# Patient Record
Sex: Male | Born: 1990 | Race: White | Hispanic: No | Marital: Single | Smoking: Current every day smoker
Health system: Southern US, Community
[De-identification: ages and names within clinical notes are randomized; demographics above are authoritative.]

## PROBLEM LIST (undated history)

## (undated) DIAGNOSIS — F909 Attention-deficit hyperactivity disorder, unspecified type: Secondary | ICD-10-CM

## (undated) DIAGNOSIS — F419 Anxiety disorder, unspecified: Secondary | ICD-10-CM

## (undated) HISTORY — DX: Attention-deficit hyperactivity disorder, unspecified type: F90.9

## (undated) HISTORY — DX: Anxiety disorder, unspecified: F41.9

## (undated) HISTORY — PX: NO PAST SURGERIES: SHX2092

---
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
## 2009-11-02 ENCOUNTER — Emergency Department: Payer: Self-pay | Admitting: Emergency Medicine

## 2010-06-19 ENCOUNTER — Emergency Department: Payer: Self-pay | Admitting: Emergency Medicine

## 2010-06-22 ENCOUNTER — Emergency Department: Payer: Self-pay | Admitting: Emergency Medicine

## 2011-08-31 ENCOUNTER — Emergency Department: Payer: Self-pay | Admitting: *Deleted

## 2012-05-28 ENCOUNTER — Emergency Department: Payer: Self-pay | Admitting: Emergency Medicine

## 2012-08-17 ENCOUNTER — Emergency Department: Payer: Self-pay | Admitting: Emergency Medicine

## 2012-10-28 ENCOUNTER — Emergency Department: Payer: Self-pay | Admitting: Emergency Medicine

## 2012-11-20 ENCOUNTER — Emergency Department: Payer: Self-pay | Admitting: Emergency Medicine

## 2013-03-24 ENCOUNTER — Emergency Department: Payer: Self-pay | Admitting: Emergency Medicine

## 2014-01-20 ENCOUNTER — Emergency Department: Payer: Self-pay | Admitting: Emergency Medicine

## 2014-01-22 ENCOUNTER — Emergency Department: Payer: Self-pay | Admitting: Emergency Medicine

## 2014-05-20 ENCOUNTER — Emergency Department: Payer: Self-pay | Admitting: Emergency Medicine

## 2014-08-07 ENCOUNTER — Encounter: Payer: Self-pay | Admitting: Emergency Medicine

## 2014-08-07 ENCOUNTER — Emergency Department: Payer: Self-pay

## 2014-08-07 ENCOUNTER — Emergency Department
Admission: EM | Admit: 2014-08-07 | Discharge: 2014-08-07 | Disposition: A | Discharge status: Home / Self Care | Payer: Self-pay | Attending: Emergency Medicine | Admitting: Emergency Medicine

## 2014-08-07 DIAGNOSIS — Y9389 Activity, other specified: Secondary | ICD-10-CM | POA: Insufficient documentation

## 2014-08-07 DIAGNOSIS — Z880 Allergy status to penicillin: Secondary | ICD-10-CM | POA: Insufficient documentation

## 2014-08-07 DIAGNOSIS — Y92410 Unspecified street and highway as the place of occurrence of the external cause: Secondary | ICD-10-CM | POA: Insufficient documentation

## 2014-08-07 DIAGNOSIS — Z720 Tobacco use: Secondary | ICD-10-CM | POA: Insufficient documentation

## 2014-08-07 DIAGNOSIS — Y998 Other external cause status: Secondary | ICD-10-CM | POA: Insufficient documentation

## 2014-08-07 DIAGNOSIS — S161XXA Strain of muscle, fascia and tendon at neck level, initial encounter: Secondary | ICD-10-CM | POA: Insufficient documentation

## 2014-08-07 MED ORDER — IBUPROFEN 800 MG PO TABS: 800.0000 mg | ORAL_TABLET | Freq: Three times a day (TID) | ORAL | Status: DC | PRN

## 2014-08-07 MED ORDER — CYCLOBENZAPRINE HCL 10 MG PO TABS
10.0000 mg | ORAL_TABLET | Freq: Three times a day (TID) | ORAL | Status: DC | PRN
Start: 2014-08-07 — End: 2019-04-21
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
## 2015-02-06 ENCOUNTER — Emergency Department (HOSPITAL_COMMUNITY)
Admission: EM | Admit: 2015-02-06 | Discharge: 2015-02-06 | Disposition: A | Discharge status: Home / Self Care | Payer: BLUE CROSS/BLUE SHIELD | Attending: Emergency Medicine | Admitting: Emergency Medicine

## 2015-02-06 ENCOUNTER — Encounter (HOSPITAL_COMMUNITY): Payer: Self-pay | Admitting: Emergency Medicine

## 2015-02-06 DIAGNOSIS — K047 Periapical abscess without sinus: Secondary | ICD-10-CM | POA: Diagnosis not present

## 2015-02-06 DIAGNOSIS — Z880 Allergy status to penicillin: Secondary | ICD-10-CM | POA: Insufficient documentation

## 2015-02-06 DIAGNOSIS — F17210 Nicotine dependence, cigarettes, uncomplicated: Secondary | ICD-10-CM | POA: Diagnosis not present

## 2015-02-06 DIAGNOSIS — K029 Dental caries, unspecified: Secondary | ICD-10-CM | POA: Insufficient documentation

## 2015-02-06 DIAGNOSIS — K0889 Other specified disorders of teeth and supporting structures: Secondary | ICD-10-CM | POA: Diagnosis present

## 2015-02-06 DIAGNOSIS — K0381 Cracked tooth: Secondary | ICD-10-CM | POA: Diagnosis not present

## 2015-02-06 MED ORDER — HYDROCODONE-ACETAMINOPHEN 5-325 MG PO TABS
2.0000 | ORAL_TABLET | Freq: Once | ORAL | Status: AC
  Administered 2015-02-06: 2 via ORAL
  Filled 2015-02-06: qty 2

## 2015-02-06 MED ORDER — CLINDAMYCIN HCL 300 MG PO CAPS: 300.0000 mg | ORAL_CAPSULE | Freq: Four times a day (QID) | ORAL | Status: DC

## 2015-02-06 MED ORDER — HYDROCODONE-ACETAMINOPHEN 5-325 MG PO TABS: ORAL_TABLET | ORAL | Status: DC

## 2015-02-06 MED ORDER — CLINDAMYCIN HCL 150 MG PO CAPS
300.0000 mg | ORAL_CAPSULE | Freq: Once | ORAL | Status: AC
  Administered 2015-02-06: 300 mg via ORAL
  Filled 2015-02-06: qty 2

## 2015-02-06 NOTE — ED Notes (Signed)
Patient c/o right upper dental pain with swelling to gums. Patient reports not being able to eat since Friday. Per patient toke oxycodone 5mg  last night with no relief. Denies taking anything for pain today.

## 2015-02-06 NOTE — ED Notes (Addendum)
Pain to right upper molars. States an ache to the area all week which became worse Thursday, unable to eat solid foods. States he has tried ibuprofen 800mg , his sister's lidocaine which she had from a previous dental visit and one of his mom's oxycodone last night, all with no relief. Small amount of facial swelling noted to right jaw area.

## 2015-02-06 NOTE — ED Provider Notes (Signed)
CSN: [id number]     Arrival date & time 02/06/15  1606 History  By signing my name below, I, Scott Juarez, attest that this documentation has been prepared under the direction and in the presence of Scott Parkinson, PA-C  Electronically Signed: Tula Juarez, ED Scribe. 02/06/2015. 4:33 PM.   Chief Complaint  Patient presents with  . Dental Pain   Patient is a 24 y.o. male presenting with tooth pain. The history is provided by the patient. No language interpreter was used.  Dental Pain Location:  Upper Upper teeth location:  1/RU 3rd molar Quality:  Aching and throbbing Severity:  Moderate Onset quality:  Gradual Duration:  1 week Timing:  Constant Progression:  Worsening Chronicity:  New Context: abscess and dental fracture   Relieved by:  Nothing Worsened by:  Jaw movement Ineffective treatments:  NSAIDs (Narcotics) Associated symptoms: facial swelling     HPI Comments: Scott Juarez is a 24 y.o. male who presents to the Emergency Department complaining of gradual onset, constant, moderate right upper dental pain associated with a dental fracture that started 1 week ago and became worse 3 days ago. He reports right-sided facial swelling and chills as associated symptoms. His pain becomes worse with chewing. Pt tried oxycodone-5 mg and Ibuprofen-800 mg with no relief. He denies difficulty swallowing.  History reviewed. No pertinent past medical history. History reviewed. No pertinent past surgical history. History reviewed. No pertinent family history. Social History  Substance Use Topics  . Smoking status: Current Every Day Smoker -- 0.50 packs/day    Types: Cigarettes  . Smokeless tobacco: Never Used  . Alcohol Use: No   Review of Systems  Constitutional: Positive for chills.  HENT: Positive for dental problem and facial swelling. Negative for trouble swallowing.   All other systems reviewed and are negative.  Allergies  Amoxicillin  Home Medications   Prior  to Admission medications   Medication Sig Start Date End Date Taking? Authorizing Provider  cyclobenzaprine (FLEXERIL) 10 MG tablet Take 1 tablet (10 mg total) by mouth every 8 (eight) hours as needed for muscle spasms. 08/07/14   Scott Crane Beers, PA-C  ibuprofen (ADVIL,MOTRIN) 800 MG tablet Take 1 tablet (800 mg total) by mouth every 8 (eight) hours as needed. 08/07/14   Scott Crane Beers, PA-C   BP 156/91 mmHg  Pulse 92  Temp(Src) 98.5 F (36.9 C) (Oral)  Resp 16  Ht 6\' 2"  (1.88 m)  Wt 200 lb (90.719 kg)  BMI 25.67 kg/m2  SpO2 100% Physical Exam  Constitutional: He appears well-developed and well-nourished. No distress.  HENT:  Head: Normocephalic and atraumatic.  Mouth/Throat: Oropharynx is clear and moist. No oropharyngeal exudate.  Tenderness and dental decay of right upper 3rd molar Mild erythema of the surrounding gingiva  Eyes: Conjunctivae and EOM are normal.  Neck: Neck supple. No tracheal deviation present.  Cardiovascular: Normal rate, regular rhythm and normal heart sounds.   Pulmonary/Chest: Effort normal and breath sounds normal. No respiratory distress. He has no wheezes. He has no rales.  Skin: Skin is warm and dry.  Psychiatric: He has a normal mood and affect. His behavior is normal.  Nursing note and vitals reviewed.   ED Course  Procedures  DIAGNOSTIC STUDIES: Oxygen Saturation is 100% on RA, normal by my interpretation.    COORDINATION OF CARE: 4:30 PM Discussed treatment plan with pt which includes antibiotics and referral to a dentist. Pt agreed to plan.    MDM   Final diagnoses:  Abscess,  dental    Pt is well appearing, airway patent.  No concerning sx's for Ludwig's angina.  Given dental referral list , he agrees to arrange f/u  I personally performed the services described in this documentation, which was scribed in my presence. The recorded information has been reviewed and is accurate.    Scott Parkinson, PA-C 02/06/15 [place],  MD 02/06/15 2230

## 2015-02-06 NOTE — Discharge Instructions (Signed)
Dental Abscess A dental abscess is pus in or around a tooth. HOME CARE  Take medicines only as told by your dentist.  If you were prescribed antibiotic medicine, finish all of it even if you start to feel better.  Rinse your mouth (gargle) often with salt water.  Do not drive or use heavy machinery, like a lawn mower, while taking pain medicine.  Do not apply heat to the outside of your mouth.  Keep all follow-up visits as told by your dentist. This is important. GET HELP IF:  Your pain is worse, and medicine does not help. GET HELP RIGHT AWAY IF:  You have a fever or chills.  Your symptoms suddenly get worse.  You have a very bad headache.  You have problems breathing or swallowing.  You have trouble opening your mouth.  You have puffiness (swelling) in your neck or around your eye.   This information is not intended to replace advice given to you by your health care provider. Make sure you discuss any questions you have with your health care provider.   Document Released: 07/06/2014 Document Reviewed: 07/06/2014 Elsevier Interactive Patient Education 2016 [employer]  

## 2016-05-17 ENCOUNTER — Encounter: Payer: Self-pay | Admitting: Emergency Medicine

## 2016-05-17 ENCOUNTER — Emergency Department
Admission: EM | Admit: 2016-05-17 | Discharge: 2016-05-17 | Disposition: A | Discharge status: Home / Self Care | Payer: BLUE CROSS/BLUE SHIELD | Attending: Emergency Medicine | Admitting: Emergency Medicine

## 2016-05-17 DIAGNOSIS — L03114 Cellulitis of left upper limb: Secondary | ICD-10-CM | POA: Insufficient documentation

## 2016-05-17 DIAGNOSIS — D1722 Benign lipomatous neoplasm of skin and subcutaneous tissue of left arm: Secondary | ICD-10-CM | POA: Insufficient documentation

## 2016-05-17 DIAGNOSIS — F17210 Nicotine dependence, cigarettes, uncomplicated: Secondary | ICD-10-CM | POA: Insufficient documentation

## 2016-05-17 MED ORDER — CLINDAMYCIN HCL 300 MG PO CAPS: 300.0000 mg | ORAL_CAPSULE | Freq: Four times a day (QID) | ORAL | 0 refills | Status: DC

## 2016-05-17 NOTE — ED Provider Notes (Signed)
[facility] Emergency Department Provider Note  ____________________________________________  Time seen: Approximately 7:31 PM  I have reviewed the triage vital signs and the nursing notes.   HISTORY  Chief Complaint Abscess    HPI Scott Juarez is a 26 y.o. male [place] emergency department for an infected "abscess" to the left arm. Patient reports that he has had a "bubble" underneath the skin over a year. Patient reports that it is "like a marble" underneath the skin. He reports over the last week areas become red, edematous. Reports that this morning in the shower area was hurting and he "squeezed it" until pus was expressed. Patient reports surrounding erythema and edema to the region. He denies any fevers or chills, nausea or vomiting. No medications prior to arrival. Patient does have a history of recurrent skin lesions   History reviewed. No pertinent past medical history.  There are no active problems to display for this patient.   History reviewed. No pertinent surgical history.  Prior to Admission medications   Medication Sig Start Date End Date Taking? Authorizing Provider  clindamycin (CLEOCIN) 300 MG capsule Take 1 capsule (300 mg total) by mouth 4 (four) times daily. 05/17/16   Charline Bills Cuthriell, PA-C  cyclobenzaprine (FLEXERIL) 10 MG tablet Take 1 tablet (10 mg total) by mouth every 8 (eight) hours as needed for muscle spasms. 08/07/14   Arlyss Repress, PA-C  HYDROcodone-acetaminophen (NORCO/VICODIN) 5-325 MG tablet Take one-two tabs po q 4-6 hrs prn pain 02/06/15   Tammy Triplett, PA-C  ibuprofen (ADVIL,MOTRIN) 800 MG tablet Take 1 tablet (800 mg total) by mouth every 8 (eight) hours as needed. 08/07/14   Arlyss Repress, PA-C    Allergies Amoxicillin  History reviewed. No pertinent family history.  Social History Social History  Substance Use Topics  . Smoking status: Current Every Day Smoker    Packs/day: 0.50    Types: Cigarettes   . Smokeless tobacco: Never Used  . Alcohol use No     Review of Systems  Constitutional: No fever/chills Eyes: No visual changes.  Cardiovascular: no chest pain. Respiratory: no cough. No SOB. Gastrointestinal: No abdominal pain.  No nausea, no vomiting.  No diarrhea.  No constipation. Musculoskeletal: Negative for musculoskeletal pain. Skin: As a "abscess" to the left forearm Neurological: Negative for headaches, focal weakness or numbness. 10-point ROS otherwise negative.  ____________________________________________   PHYSICAL EXAM:  VITAL SIGNS: ED Triage Vitals  Enc Vitals Group     BP 05/17/16 1902 (!) 150/80     Pulse Rate 05/17/16 1902 83     Resp 05/17/16 1902 16     Temp 05/17/16 1902 97.7 F (36.5 C)     Temp Source 05/17/16 1902 Oral     SpO2 05/17/16 1902 100 %     Weight 05/17/16 1902 190 lb (86.2 kg)     Height 05/17/16 1902 6\' 2"  (1.88 m)     Head Circumference --      Peak Flow --      Pain Score 05/17/16 1903 3     Pain Loc --      Pain Edu? --      Excl. in [place]? --      Constitutional: Alert and oriented. Well appearing and in no acute distress. Eyes: Conjunctivae are normal. PERRL. EOMI. Head: Atraumatic.Marland Kitchen  Neck: No stridor.   Cardiovascular: Normal rate, regular rhythm. Normal S1 and S2.  Good peripheral circulation. Respiratory: Normal respiratory effort without tachypnea or retractions. Lungs  CTAB. Good air entry to the bases with no decreased or absent breath sounds. Musculoskeletal: Full range of motion to all extremities. No gross deformities appreciated. Neurologic:  Normal speech and language. No gross focal neurologic deficits are appreciated.  Skin:  Skin is warm, dry and intact. No rash noted. The medicine edematous skin lesion noted to the left lateral elbow. Area is firm to palpation but no induration or fluctuance. Crusted drainage is noted on the most inferior aspect of lesion. The patient lesion does not express any pustular  drainage. Psychiatric: Mood and affect are normal. Speech and behavior are normal. Patient exhibits appropriate insight and judgement.   ____________________________________________   LABS (all labs ordered are listed, but only abnormal results are displayed)  Labs Reviewed - No data to display ____________________________________________  EKG   ____________________________________________  RADIOLOGY   No results found.  ____________________________________________    PROCEDURES  Procedure(s) performed:    Procedures    Medications - No data to display   ____________________________________________   INITIAL IMPRESSION / ASSESSMENT AND PLAN / ED COURSE  Pertinent labs & imaging results that were available during my care of the patient were reviewed by me and considered in my medical decision making (see chart for details).  Review of the [place] CSRS was performed in accordance of the [place] prior to dispensing any controlled drugs.     Patient's diagnosis is consistent with lipoma to the left elbow with overlying infection. At this time, exam does not reveal abscess requiring incision and drainage. Patient does have a history of recurrent skin lesions, and as such she will be placed on clindamycin for coverage. Patient is to follow-up with dermatology for recurrent skin lesions and multiple lipomas.  Patient is given ED precautions to return to the ED for any worsening or new symptoms.     ____________________________________________  FINAL CLINICAL IMPRESSION(S) / ED DIAGNOSES  Final diagnoses:  Cellulitis of left upper extremity  Lipoma of left upper extremity      NEW MEDICATIONS STARTED DURING THIS VISIT:  Discharge Medication List as of 05/17/2016  7:34 PM          This chart was dictated using voice recognition software/Dragon. Despite best efforts to proofread, errors can occur which can change the meaning. Any change was purely  unintentional.    Darletta Moll, PA-C 05/17/16 1958    Lavonia Drafts, MD 05/17/16 [phone]

## 2016-05-17 NOTE — ED Triage Notes (Signed)
Pt ambulatory to triage in NAD, reports abscess to left arm x 1 year, but states over past couple days has become red and painful.  Pt reports some drainage.

## 2019-01-21 ENCOUNTER — Emergency Department
Admission: EM | Admit: 2019-01-21 | Discharge: 2019-01-21 | Discharge status: Against Medical Advice | Payer: BLUE CROSS/BLUE SHIELD

## 2019-01-21 NOTE — ED Notes (Signed)
No answer when called several times from lobby 

## 2019-04-21 ENCOUNTER — Encounter: Payer: Self-pay | Admitting: Emergency Medicine

## 2019-04-21 ENCOUNTER — Other Ambulatory Visit: Payer: Self-pay

## 2019-04-21 ENCOUNTER — Emergency Department: Payer: BLUE CROSS/BLUE SHIELD

## 2019-04-21 ENCOUNTER — Emergency Department
Admission: EM | Admit: 2019-04-21 | Discharge: 2019-04-21 | Disposition: A | Discharge status: Home / Self Care | Payer: BLUE CROSS/BLUE SHIELD | Attending: Emergency Medicine | Admitting: Emergency Medicine

## 2019-04-21 DIAGNOSIS — Y999 Unspecified external cause status: Secondary | ICD-10-CM | POA: Insufficient documentation

## 2019-04-21 DIAGNOSIS — W108XXA Fall (on) (from) other stairs and steps, initial encounter: Secondary | ICD-10-CM | POA: Diagnosis not present

## 2019-04-21 DIAGNOSIS — F17210 Nicotine dependence, cigarettes, uncomplicated: Secondary | ICD-10-CM | POA: Diagnosis not present

## 2019-04-21 DIAGNOSIS — Y929 Unspecified place or not applicable: Secondary | ICD-10-CM | POA: Insufficient documentation

## 2019-04-21 DIAGNOSIS — S3992XA Unspecified injury of lower back, initial encounter: Secondary | ICD-10-CM | POA: Diagnosis present

## 2019-04-21 DIAGNOSIS — Y9389 Activity, other specified: Secondary | ICD-10-CM | POA: Insufficient documentation

## 2019-04-21 DIAGNOSIS — M533 Sacrococcygeal disorders, not elsewhere classified: Secondary | ICD-10-CM | POA: Diagnosis not present

## 2019-04-21 DIAGNOSIS — S300XXA Contusion of lower back and pelvis, initial encounter: Secondary | ICD-10-CM | POA: Diagnosis not present

## 2019-04-21 MED ORDER — OXYCODONE-ACETAMINOPHEN 7.5-325 MG PO TABS: 1.0000 | ORAL_TABLET | Freq: Four times a day (QID) | ORAL | 0 refills | Status: DC | PRN

## 2019-04-21 MED ORDER — LIDOCAINE 5 % EX PTCH
1.0000 | MEDICATED_PATCH | CUTANEOUS | Status: DC
  Administered 2019-04-21: 09:00:00 1 via TRANSDERMAL
  Filled 2019-04-21: qty 1

## 2019-04-21 MED ORDER — IBUPROFEN 600 MG PO TABS: 600.0000 mg | ORAL_TABLET | Freq: Three times a day (TID) | ORAL | 0 refills | Status: DC | PRN

## 2019-04-21 NOTE — ED Triage Notes (Signed)
Patient reports slipping and falling and hitting his buttocks on the corner of the step that he tripped on.  Patient is complaining of pain with sitting.  Patient appears uncomfortable.  Patient ambulatory to triage.

## 2019-04-21 NOTE — ED Provider Notes (Signed)
[facility] Emergency Department Provider Note   ____________________________________________   First MD Initiated Contact with Patient 04/21/19 [phone]     (approximate)  I have reviewed the triage vital signs and the nursing notes.   HISTORY  Chief Complaint Fall    HPI Scott Juarez is a 29 y.o. male patient complain of tailbone pain secondary to a slip and fall earlier this morning.  Patient said he slipped and fell landing on his coccyx on the edge of a step.  Patient the pain has increased since incident.  Patient state he cannot sit comfortably secondary to the pain.  Patient rates pain as a 10/10.  Patient scribed pain is "sharp".  Patient denies radicular component to his pain.  No palliative measure for complaint.         History reviewed. No pertinent past medical history.  There are no problems to display for this patient.   History reviewed. No pertinent surgical history.  Prior to Admission medications   Medication Sig Start Date End Date Taking? Authorizing Provider  ibuprofen (ADVIL) 600 MG tablet Take 1 tablet (600 mg total) by mouth every 8 (eight) hours as needed. 04/21/19   Sable Feil, PA-C  oxyCODONE-acetaminophen (PERCOCET) 7.5-325 MG tablet Take 1 tablet by mouth every 6 (six) hours as needed. 04/21/19   Sable Feil, PA-C    Allergies Amoxicillin  No family history on file.  Social History Social History   Tobacco Use  . Smoking status: Current Every Day Smoker    Packs/day: 0.50    Types: Cigarettes  . Smokeless tobacco: Never Used  Substance Use Topics  . Alcohol use: No  . Drug use: No    Review of Systems Constitutional: No fever/chills Eyes: No visual changes. ENT: No sore throat. Cardiovascular: Denies chest pain. Respiratory: Denies shortness of breath. Gastrointestinal: No abdominal pain.  No nausea, no vomiting.  No diarrhea.  No constipation. Genitourinary: Negative for  dysuria. Musculoskeletal: Positive for back pain. Skin: Negative for rash. Neurological: Negative for headaches, focal weakness or numbness.   ____________________________________________   PHYSICAL EXAM:  VITAL SIGNS: ED Triage Vitals [04/21/19 0804]  Enc Vitals Group     BP 134/76     Pulse Rate 80     Resp 20     Temp 97.7 F (36.5 C)     Temp Source Oral     SpO2 100 %     Weight 175 lb (79.4 kg)     Height 6\' 2"  (1.88 m)     Head Circumference      Peak Flow      Pain Score 10     Pain Loc      Pain Edu?      Excl. in [place]?     Constitutional: Alert and oriented. Well appearing and in no acute distress. Neck: No cervical spine tenderness to palpation.**} Hematological/Lymphatic/Immunilogical: No cervical lymphadenopathy. Cardiovascular: Normal rate, regular rhythm. Grossly normal heart sounds.  Good peripheral circulation. Respiratory: Normal respiratory effort.  No retractions. Lungs CTAB. Gastrointestinal: Soft and nontender. No distention. No abdominal bruits. No CVA tenderness. Genitourinary: Deferred }Musculoskeletal: No obvious deformity.  Patient has moderate guarding palpation of L3-S1.  No lower extremity tenderness nor edema.  No joint effusions. Neurologic:  Normal speech and language. No gross focal neurologic deficits are appreciated. No gait instability. Skin:  Skin is warm, dry and intact. No rash noted. Psychiatric: Mood and affect are normal. Speech and behavior are normal.  ____________________________________________   LABS (all labs ordered are listed, but only abnormal results are displayed)  Labs Reviewed - No data to display ____________________________________________  EKG   ____________________________________________  RADIOLOGY  ED MD interpretation:    Official radiology report(s): DG Sacrum/Coccyx  Result Date: 04/21/2019 CLINICAL DATA:  Fall with pain. EXAM: SACRUM AND COCCYX - 2+ VIEW COMPARISON:  None. FINDINGS: No  evidence of fracture or sacroiliac/coccygeal malalignment. No noted degenerative changes or significant soft tissue finding. IMPRESSION: Negative. Electronically Signed   By: Monte Fantasia M.D.   On: 04/21/2019 08:50    ____________________________________________   PROCEDURES  Procedure(s) performed (including Critical Care):  Procedures   ____________________________________________   INITIAL IMPRESSION / ASSESSMENT AND PLAN / ED COURSE  As part of my medical decision making, I reviewed the following data within the [place]     Patient presents with coccyx pain secondary to a slip and fall.  Discussed negative x-ray findings with patient.  Patient physical exam consistent with coccyx contusion.  Patient given discharge care instructions and advised to take medication as directed.  Patient advised follow-up with PCP if condition persist.   Scott Juarez was evaluated in Emergency Department on 04/21/2019 for the symptoms described in the history of present illness. He was evaluated in the context of the global COVID-19 pandemic, which necessitated consideration that the patient might be at risk for infection with the SARS-CoV-2 virus that causes COVID-19. Institutional protocols and algorithms that pertain to the evaluation of patients at risk for COVID-19 are in a state of rapid change based on information released by regulatory bodies including the CDC and federal and state organizations. These policies and algorithms were followed during the patient's care in the ED.       ____________________________________________   FINAL CLINICAL IMPRESSION(S) / ED DIAGNOSES  Final diagnoses:  Contusion of coccyx, initial encounter     ED Discharge Orders         Ordered    oxyCODONE-acetaminophen (PERCOCET) 7.5-325 MG tablet  Every 6 hours PRN     04/21/19 0901    ibuprofen (ADVIL) 600 MG tablet  Every 8 hours PRN     04/21/19 0901           Note:   This document was prepared using Dragon voice recognition software and may include unintentional dictation errors.    Sable Feil, PA-C 04/21/19 [id number]    Carrie Mew, MD 04/21/19 [phone]

## 2019-04-21 NOTE — ED Notes (Signed)
See triage note  States he fell   Hitting his buttocks on corner of step  Having increased pain with sitting  Describes pain as sharp  Ambulates slow;y d/t pain

## 2019-04-21 NOTE — Discharge Instructions (Signed)
Follow discharge care instruction take medication as directed.  Advised to purchase a "doughnut seat cushion".

## 2019-06-10 ENCOUNTER — Emergency Department: Payer: Self-pay

## 2019-06-10 ENCOUNTER — Encounter: Payer: Self-pay | Admitting: Emergency Medicine

## 2019-06-10 ENCOUNTER — Emergency Department
Admission: EM | Admit: 2019-06-10 | Discharge: 2019-06-10 | Disposition: A | Discharge status: Home / Self Care | Payer: Self-pay | Attending: Emergency Medicine | Admitting: Emergency Medicine

## 2019-06-10 ENCOUNTER — Other Ambulatory Visit: Payer: Self-pay

## 2019-06-10 DIAGNOSIS — R42 Dizziness and giddiness: Secondary | ICD-10-CM | POA: Insufficient documentation

## 2019-06-10 DIAGNOSIS — F17210 Nicotine dependence, cigarettes, uncomplicated: Secondary | ICD-10-CM | POA: Insufficient documentation

## 2019-06-10 DIAGNOSIS — R251 Tremor, unspecified: Secondary | ICD-10-CM | POA: Insufficient documentation

## 2019-06-10 DIAGNOSIS — F1210 Cannabis abuse, uncomplicated: Secondary | ICD-10-CM | POA: Insufficient documentation

## 2019-06-10 LAB — COMPREHENSIVE METABOLIC PANEL
ALT: 19 U/L (ref 0–44)
AST: 23 U/L (ref 15–41)
Albumin: 4.5 g/dL (ref 3.5–5.0)
Alkaline Phosphatase: 67 U/L (ref 38–126)
Anion gap: 8 (ref 5–15)
BUN: 13 mg/dL (ref 6–20)
CO2: 26 mmol/L (ref 22–32)
Calcium: 9.6 mg/dL (ref 8.9–10.3)
Chloride: 102 mmol/L (ref 98–111)
Creatinine, Ser: 1 mg/dL (ref 0.61–1.24)
GFR calc Af Amer: >60 mL/min (ref >60)
GFR calc non Af Amer: >60 mL/min (ref >60)
Glucose, Bld: 116 mg/dL — ABNORMAL HIGH (ref 70–99)
Potassium: 4.3 mmol/L (ref 3.5–5.1)
Sodium: 136 mmol/L (ref 135–145)
Total Bilirubin: 0.6 mg/dL (ref 0.3–1.2)
Total Protein: 7.7 g/dL (ref 6.5–8.1)

## 2019-06-10 LAB — URINALYSIS, COMPLETE (UACMP) WITH MICROSCOPIC
Bacteria, UA: NONE SEEN
Bilirubin Urine: NEGATIVE
Glucose, UA: NEGATIVE mg/dL
Hgb urine dipstick: NEGATIVE
Ketones, ur: NEGATIVE mg/dL
Leukocytes,Ua: NEGATIVE
Nitrite: NEGATIVE
Protein, ur: NEGATIVE mg/dL
Specific Gravity, Urine: 1.018 (ref 1.005–1.030)
Squamous Epithelial / LPF: NONE SEEN (ref 0–5)
pH: 6 (ref 5.0–8.0)

## 2019-06-10 LAB — CBC
HCT: 46.8 % (ref 39.0–52.0)
Hemoglobin: 15.2 g/dL (ref 13.0–17.0)
MCH: 30.6 pg (ref 26.0–34.0)
MCHC: 32.5 g/dL (ref 30.0–36.0)
MCV: 94.4 fL (ref 80.0–100.0)
Platelets: 220 10*3/uL (ref 150–400)
RBC: 4.96 MIL/uL (ref 4.22–5.81)
RDW: 12.2 % (ref 11.5–15.5)
WBC: 9.9 10*3/uL (ref 4.0–10.5)
nRBC: 0 % (ref 0.0–0.2)

## 2019-06-10 LAB — URINE DRUG SCREEN, QUALITATIVE (ARMC ONLY)
Amphetamines, Ur Screen: NOT DETECTED
Barbiturates, Ur Screen: NOT DETECTED
Benzodiazepine, Ur Scrn: NOT DETECTED
Cannabinoid 50 Ng, Ur ~~LOC~~: POSITIVE — AB
Cocaine Metabolite,Ur ~~LOC~~: NOT DETECTED
MDMA (Ecstasy)Ur Screen: NOT DETECTED
Methadone Scn, Ur: NOT DETECTED
Opiate, Ur Screen: NOT DETECTED
Phencyclidine (PCP) Ur S: NOT DETECTED
Tricyclic, Ur Screen: NOT DETECTED

## 2019-06-10 LAB — CK: Total CK: 283 U/L (ref 49–397)

## 2019-06-10 MED ORDER — LORAZEPAM 1 MG PO TABS: 0.5000 mg | ORAL_TABLET | Freq: Two times a day (BID) | ORAL | 0 refills | Status: DC | PRN

## 2019-06-10 NOTE — ED Notes (Signed)
Patient at CT currently. 

## 2019-06-10 NOTE — ED Triage Notes (Signed)
Patient ambulatory to triage with steady gait, without difficulty or distress noted, mask in place; pt reports dizziness and feeling shaky tonight

## 2019-06-10 NOTE — ED Provider Notes (Signed)
[facility] Emergency Department Provider Note  ____________________________________________   First MD Initiated Contact with Patient 06/10/19 [phone]     (approximate)  I have reviewed the triage vital signs and the nursing notes.   HISTORY  Chief Complaint Dizziness   HPI Scott Juarez is a 29 y.o. male presents to the emergency department secondary to multiple episodes that began yesterday of dizziness and "feeling shaky".  Patient states that symptoms occur for approximately 20 minutes at longest duration and then spontaneously resolved.  Patient denies any weakness no numbness gait instability or visual changes.  Patient denies any nausea or vomiting.  Patient denies any chest pain or shortness of breath.  Patient denies any headache        History reviewed. No pertinent past medical history.  There are no problems to display for this patient.   History reviewed. No pertinent surgical history.  Prior to Admission medications   Medication Sig Start Date End Date Taking? Authorizing Provider  ibuprofen (ADVIL) 600 MG tablet Take 1 tablet (600 mg total) by mouth every 8 (eight) hours as needed. 04/21/19   Sable Feil, PA-C  oxyCODONE-acetaminophen (PERCOCET) 7.5-325 MG tablet Take 1 tablet by mouth every 6 (six) hours as needed. 04/21/19   Sable Feil, PA-C    Allergies Amoxicillin  No family history on file.  Social History Social History   Tobacco Use  . Smoking status: Current Every Day Smoker    Packs/day: 0.50    Types: Cigarettes  . Smokeless tobacco: Never Used  Substance Use Topics  . Alcohol use: No  . Drug use: No    Review of Systems Constitutional: No fever/chills Eyes: No visual changes. ENT: No sore throat. Cardiovascular: Denies chest pain. Respiratory: Denies shortness of breath. Gastrointestinal: No abdominal pain.  No nausea, no vomiting.  No diarrhea.  No constipation. Genitourinary: Negative for  dysuria. Musculoskeletal: Negative for neck pain.  Negative for back pain. Integumentary: Negative for rash. Neurological: Negative for headaches, focal weakness or numbness. ________________   PHYSICAL EXAM:  VITAL SIGNS: ED Triage Vitals [06/10/19 0452]  Enc Vitals Group     BP (!) 169/89     Pulse Rate 95     Resp 18     Temp 97.9 F (36.6 C)     Temp Source Oral     SpO2 100 %     Weight 77.1 kg (170 lb)     Height 1.88 m (6\' 2" )     Head Circumference      Peak Flow      Pain Score 0     Pain Loc      Pain Edu?      Excl. in [place]?     Constitutional: Alert and oriented.  Eyes: Conjunctivae are normal.  Head: Atraumatic. Mouth/Throat: Patient is wearing a mask. Neck: No stridor.  No meningeal signs.   Cardiovascular: Normal rate, regular rhythm. Good peripheral circulation. Grossly normal heart sounds. Respiratory: Normal respiratory effort.  No retractions. Gastrointestinal: Soft and nontender. No distention.   Musculoskeletal: No lower extremity tenderness nor edema. No gross deformities of extremities. Neurologic:  Normal speech and language. No gross focal neurologic deficits are appreciated.  Skin:  Skin is warm, dry and intact. Psychiatric: Mood and affect are normal. Speech and behavior are normal.  ____________________________________________   LABS (all labs ordered are listed, but only abnormal results are displayed)  Labs Reviewed  COMPREHENSIVE METABOLIC PANEL - Abnormal; Notable for the  following components:      Result Value   Glucose, Bld 116 (*)    All other components within normal limits  URINALYSIS, COMPLETE (UACMP) WITH MICROSCOPIC - Abnormal; Notable for the following components:   Color, Urine YELLOW (*)    APPearance CLEAR (*)    All other components within normal limits  URINE DRUG SCREEN, QUALITATIVE ([facility] ONLY) - Abnormal; Notable for the following components:   Cannabinoid 50 Ng, Ur [place] POSITIVE (*)    All other components within  normal limits  CBC  CK   ____________________________________________  EKG ED ECG REPORT I, [place] Jahyra Sukup, the attending physician, personally viewed and interpreted this ECG.   Date: 06/10/2019  EKG Time: 4:58 AM  Rate: 83  Rhythm: Normal sinus rhythm  Axis: Normal  Intervals: Normal  ST&T Change: None     Procedures   ____________________________________________   INITIAL IMPRESSION / MDM / ASSESSMENT AND PLAN / ED COURSE  As part of my medical decision making, I reviewed the following data within the electronic MEDICAL RECORD NUMBER   29 year old male presented with above-stated history and physical exam differential diagnosis including vertigo central versus peripheral, anxiety.  Patient's laboratory data revealed no gross abnormality.  Patient does admit to marijuana use occasionally which was noted on UDS.  CT scan of the head revealed no acute intracranial abnormality.  Patient referred to primary care provider for other outpatient evaluation.  ____________________________________________  FINAL CLINICAL IMPRESSION(S) / ED DIAGNOSES  Final diagnoses:  Dizziness     MEDICATIONS GIVEN DURING THIS VISIT:  Medications - No data to display   ED Discharge Orders    None      *Please note:  Scott Juarez was evaluated in Emergency Department on 06/10/2019 for the symptoms described in the history of present illness. He was evaluated in the context of the global COVID-19 pandemic, which necessitated consideration that the patient might be at risk for infection with the SARS-CoV-2 virus that causes COVID-19. Institutional protocols and algorithms that pertain to the evaluation of patients at risk for COVID-19 are in a state of rapid change based on information released by regulatory bodies including the CDC and federal and state organizations. These policies and algorithms were followed during the patient's care in the ED.  Some ED evaluations and interventions may be  delayed as a result of limited staffing during the pandemic.*  Note:  This document was prepared using Dragon voice recognition software and may include unintentional dictation errors.   Gregor Hams, MD 06/11/19 [phone]

## 2019-09-15 ENCOUNTER — Telehealth: Payer: Self-pay | Admitting: General Practice

## 2019-09-15 NOTE — Telephone Encounter (Signed)
Individual has been contacted 3+ times regarding ED referral. No further attempts to contact individual will be made. 

## 2019-09-24 ENCOUNTER — Ambulatory Visit: Payer: Self-pay

## 2019-10-04 ENCOUNTER — Other Ambulatory Visit: Payer: Self-pay

## 2019-10-04 ENCOUNTER — Encounter: Payer: Self-pay | Admitting: Emergency Medicine

## 2019-10-04 ENCOUNTER — Emergency Department: Payer: Self-pay

## 2019-10-04 DIAGNOSIS — S060X1A Concussion with loss of consciousness of 30 minutes or less, initial encounter: Secondary | ICD-10-CM | POA: Insufficient documentation

## 2019-10-04 DIAGNOSIS — Y999 Unspecified external cause status: Secondary | ICD-10-CM | POA: Insufficient documentation

## 2019-10-04 DIAGNOSIS — Y9289 Other specified places as the place of occurrence of the external cause: Secondary | ICD-10-CM | POA: Insufficient documentation

## 2019-10-04 DIAGNOSIS — S40011A Contusion of right shoulder, initial encounter: Secondary | ICD-10-CM | POA: Insufficient documentation

## 2019-10-04 DIAGNOSIS — S0003XA Contusion of scalp, initial encounter: Secondary | ICD-10-CM | POA: Insufficient documentation

## 2019-10-04 DIAGNOSIS — F17210 Nicotine dependence, cigarettes, uncomplicated: Secondary | ICD-10-CM | POA: Insufficient documentation

## 2019-10-04 DIAGNOSIS — Y9389 Activity, other specified: Secondary | ICD-10-CM | POA: Insufficient documentation

## 2019-10-04 DIAGNOSIS — W228XXA Striking against or struck by other objects, initial encounter: Secondary | ICD-10-CM | POA: Insufficient documentation

## 2019-10-04 NOTE — ED Notes (Signed)
Patient transported to X-ray 
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
## 2019-10-05 ENCOUNTER — Emergency Department
Admission: EM | Admit: 2019-10-05 | Discharge: 2019-10-05 | Disposition: A | Discharge status: Home / Self Care | Payer: Self-pay | Attending: Emergency Medicine | Admitting: Emergency Medicine

## 2019-10-05 DIAGNOSIS — S40011A Contusion of right shoulder, initial encounter: Secondary | ICD-10-CM

## 2019-10-05 DIAGNOSIS — S060X1A Concussion with loss of consciousness of 30 minutes or less, initial encounter: Secondary | ICD-10-CM

## 2019-10-05 DIAGNOSIS — S0003XA Contusion of scalp, initial encounter: Secondary | ICD-10-CM

## 2019-10-05 MED ORDER — ONDANSETRON 4 MG PO TBDP
4.0000 mg | ORAL_TABLET | Freq: Once | ORAL | Status: AC
  Administered 2019-10-05: 4 mg via ORAL
  Filled 2019-10-05: qty 1

## 2019-10-05 MED ORDER — ONDANSETRON 4 MG PO TBDP: 4.0000 mg | ORAL_TABLET | Freq: Four times a day (QID) | ORAL | 0 refills | Status: DC | PRN

## 2019-10-05 NOTE — Discharge Instructions (Signed)
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
## 2019-10-07 ENCOUNTER — Other Ambulatory Visit: Payer: Self-pay

## 2019-10-07 ENCOUNTER — Encounter: Payer: Self-pay | Admitting: Gerontology

## 2019-10-07 ENCOUNTER — Ambulatory Visit: Payer: Self-pay | Admitting: Gerontology

## 2019-10-07 VITALS — BP 125/77 | HR 76 | Ht 74.0 in | Wt 199.0 lb

## 2019-10-07 DIAGNOSIS — Z7689 Persons encountering health services in other specified circumstances: Secondary | ICD-10-CM

## 2019-10-07 DIAGNOSIS — Z8659 Personal history of other mental and behavioral disorders: Secondary | ICD-10-CM | POA: Insufficient documentation

## 2019-10-07 DIAGNOSIS — F17200 Nicotine dependence, unspecified, uncomplicated: Secondary | ICD-10-CM

## 2019-10-07 NOTE — Patient Instructions (Signed)
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
## 2019-10-08 LAB — URINALYSIS
Bilirubin, UA: NEGATIVE
Glucose, UA: NEGATIVE
Ketones, UA: NEGATIVE
Leukocytes,UA: NEGATIVE
Nitrite, UA: NEGATIVE
Protein,UA: NEGATIVE
RBC, UA: NEGATIVE
Specific Gravity, UA: 1.013 (ref 1.005–1.030)
Urobilinogen, Ur: 1 mg/dL (ref 0.2–1.0)
pH, UA: 5.5 (ref 5.0–7.5)

## 2019-10-08 LAB — CBC WITH DIFFERENTIAL/PLATELET
Basophils Absolute: 0.1 10*3/uL (ref 0.0–0.2)
Basos: 1 %
EOS (ABSOLUTE): 0.2 10*3/uL (ref 0.0–0.4)
Eos: 3 %
Hematocrit: 44.8 % (ref 37.5–51.0)
Hemoglobin: 14.8 g/dL (ref 13.0–17.7)
Immature Grans (Abs): 0 10*3/uL (ref 0.0–0.1)
Immature Granulocytes: 0 %
Lymphocytes Absolute: 2.4 10*3/uL (ref 0.7–3.1)
Lymphs: 30 %
MCH: 30.4 pg (ref 26.6–33.0)
MCHC: 33 g/dL (ref 31.5–35.7)
MCV: 92 fL (ref 79–97)
Monocytes Absolute: 0.9 10*3/uL (ref 0.1–0.9)
Monocytes: 11 %
Neutrophils Absolute: 4.6 10*3/uL (ref 1.4–7.0)
Neutrophils: 55 %
Platelets: 227 10*3/uL (ref 150–450)
RBC: 4.87 x10E6/uL (ref 4.14–5.80)
RDW: 12.4 % (ref 11.6–15.4)
WBC: 8.3 10*3/uL (ref 3.4–10.8)

## 2019-10-08 LAB — COMPREHENSIVE METABOLIC PANEL
ALT: 17 IU/L (ref 0–44)
AST: 26 IU/L (ref 0–40)
Albumin/Globulin Ratio: 1.9 (ref 1.2–2.2)
Albumin: 4.6 g/dL (ref 4.1–5.2)
Alkaline Phosphatase: 94 IU/L (ref 48–121)
BUN/Creatinine Ratio: 10 (ref 9–20)
BUN: 11 mg/dL (ref 6–20)
Bilirubin Total: 0.4 mg/dL (ref 0.0–1.2)
CO2: 24 mmol/L (ref 20–29)
Calcium: 9.8 mg/dL (ref 8.7–10.2)
Chloride: 101 mmol/L (ref 96–106)
Creatinine, Ser: 1.09 mg/dL (ref 0.76–1.27)
GFR calc Af Amer: 105 mL/min/{1.73_m2} (ref >59)
GFR calc non Af Amer: 91 mL/min/{1.73_m2} (ref >59)
Globulin, Total: 2.4 g/dL (ref 1.5–4.5)
Glucose: 75 mg/dL (ref 65–99)
Potassium: 3.7 mmol/L (ref 3.5–5.2)
Sodium: 140 mmol/L (ref 134–144)
Total Protein: 7 g/dL (ref 6.0–8.5)

## 2019-10-08 LAB — TSH: TSH: 1.7 u[IU]/mL (ref 0.450–4.500)

## 2019-10-22 ENCOUNTER — Institutional Professional Consult (permissible substitution): Payer: Self-pay | Admitting: Licensed Clinical Social Worker

## 2019-11-03 ENCOUNTER — Ambulatory Visit: Payer: Self-pay | Admitting: Licensed Clinical Social Worker

## 2019-11-03 ENCOUNTER — Other Ambulatory Visit: Payer: Self-pay

## 2019-11-03 DIAGNOSIS — F4001 Agoraphobia with panic disorder: Secondary | ICD-10-CM

## 2019-11-06 NOTE — BH Specialist Note (Addendum)
Opened in error
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
## 2019-11-11 ENCOUNTER — Other Ambulatory Visit: Payer: Self-pay

## 2019-11-11 ENCOUNTER — Ambulatory Visit: Payer: Self-pay | Admitting: Licensed Clinical Social Worker

## 2019-11-11 DIAGNOSIS — F4001 Agoraphobia with panic disorder: Secondary | ICD-10-CM

## 2019-11-11 MED ORDER — MIRTAZAPINE 15 MG PO TABS: 15.0000 mg | ORAL_TABLET | Freq: Every day | ORAL | 0 refills | Status: DC

## 2019-11-11 NOTE — BH Specialist Note (Signed)
Integrated Behavioral Health Follow Up Visit  MRN: [id number] Name: Scott Juarez   Type of Service: [place] Interpretor:No. Interpretor Name and Language: None  SUBJECTIVE: Scott Juarez is a 29 y.o. male accompanied by hisself Patient was referred by Court Joy, NP for mental health Patient reports the following symptoms/concerns: The patient reports that his has been doing pretty good this week and had to work more this week which kept his mind off his problems. 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 Patient shared that his first panic attack happened out of the blue while on the job several months back. He stated he felt like he was going to pass out and had to slide down the wall because it felt like he was having a heart attack. He says when he is not working he stays at home in bed and watches a lot of television. However, he says he often finds himself in a daze staring at a corner lost in his own thoughts and not on the TV show. The patient reports that, "I  can't fall asleep and when I do I can't wake up." This patient reports the exception to this is when he wakes up in a panic.The patient stated he sometimes has to pull over while driving due to a panic attack. He further reports that he sometimes "sobs uncontrollably because of a song on the radio." The patient expressed concern that his moods are all over the place depending on the day. He says he struggles with racing thoughts and excessive worrying. The patient agreed with the case consultation recommendations for bi-weekly therapy session at [facility], and Mirtazapine 15 mg at bedtime. The patient denied any suicidal or homicidal thoughts.    Duration of problem: ; Severity of problem: moderate  OBJECTIVE: Mood: Anxious and Affect: Appropriate Risk of harm to self or others: No plan to harm self or others  LIFE CONTEXT: Family and Social: see  above School/Work: see above  Self-Care: see above                              Life Changes: see above                                  GOALS ADDRESSED: Patient will: 1.  Reduce symptoms of: anxiety, depression and insomnia  2.  Increase knowledge and/or ability of: coping skills, healthy habits and stress reduction  3.  Demonstrate ability to: Increase healthy adjustment to current life circumstances and Decrease self-medicating behaviors  INTERVENTIONS: Interventions utilized:  Supportive Counseling was utilized by the clinician during today's follow up session. The clinician processed with the patient how he has been doing since his integrated behavioral health assessment last week. The clinician used Psychoeducation explaining to the patient the recommendations from the case consultation team and that medication, Mirtazapine 15 mg was prescribed to him by Dr Royston Bake, MD. The clinician informed the patient that the full intended effect of the Mirtazapine may take up to 2-4 weeks to be evident. Futher the clinician reviewed the side effects with he patient including that the medication will make him sleepy and should be taken before bedtime.The clinician informed the patient that therapy sessions will be bi-weekly and provided the patient with contact information for [facility] and  [facility]'s crisis  walk in should he need it. Clinician measured the patient's anxiety and depression on a numerical scale.  Standardized Assessments completed: GAD-7 and PHQ 9  Gad- 7 18 PHQ 9 16  ASSESSMENT: Patient currently experiencing see above  Patient may benefit from see above   PLAN: 1. Follow up with behavioral health clinician on : 11/25/2019 at 3:00 pm 2. Behavioral recommendations:  3. Referral(s): [place] (In Clinic) 4. "From scale of 1-10, how likely are you to follow plan?":   Lesli Albee, Student-Social Work

## 2019-11-25 ENCOUNTER — Ambulatory Visit: Payer: Self-pay | Admitting: Licensed Clinical Social Worker

## 2019-11-25 ENCOUNTER — Other Ambulatory Visit: Payer: Self-pay

## 2019-11-25 DIAGNOSIS — F4001 Agoraphobia with panic disorder: Secondary | ICD-10-CM

## 2019-11-25 NOTE — BH Specialist Note (Signed)
Integrated Behavioral Health Follow Up Visit  MRN: [id number] Name: Scott Juarez   Type of Service: [place] Interpretor:No. Interpretor Name and Language: none  SUBJECTIVE: Scott Juarez is a 29 y.o. male accompanied by his mother Patient was referred by  [name], NP for mental health Patient reports the following symptoms/concerns: The patient reports that he was seen by the [place] crisis team on 11/25/2019. He states he stopped taking the Mirtazapine two nights ago. He reports he understands the plan to be referred to [facility] and agrees to go to [place] walk in on Friday 11/27/2019. The patient agrees to begin taking Trazodone 25 MG twice a day and 50 MG at bedtime. The p[atient reports his grandfather is still in the hospital and can only have one visitor, but he is doing better. The patient denies any suicidal thoughts, means or access to carry out means at this time.   Duration of problem:; Severity of problem: moderate  OBJECTIVE: Mood: Anxious and Depressed and Affect: Labile Risk of harm to self or others: No plan to harm self or others  LIFE CONTEXT: Family and Social: see above School/Work: see above Self-Care: see above Life Changes: see above  GOALS ADDRESSED: Patient will: 1.  Reduce symptoms of: anxiety, depression and stress  2.  Increase knowledge and/or ability of: coping skills, self-management skills and stress reduction  3.  Demonstrate ability to: Increase healthy adjustment to current life circumstances and Decrease self-medicating behaviors  INTERVENTIONS: Interventions utilized:  Brief CBT was utilized by the clinician during today's follow up visit. Clinician processed with the patient how he has been doing since his last follow up session yesterday 11/25/2019. Clinician encouraged patient to take his medication as prescribed and at the same time each day. The Clinician explained to the patient that this medication may  take 4-6 weeks to reach its full intended effect. The clinician gave the patient information regarding his refills of his medication and instructions to go to the walk in clinic at [facility] on 11/27/2019 to be seen.  Standardized Assessments completed: Suicidal Risk Assessment. The Patient denies any suicidal thoughts, means or access to carry out means at this time.    ASSESSMENT: Patient currently experiencing see above.   Patient may benefit from see above  PLAN: 1. Follow up with behavioral health clinician on :  2. Behavioral recommendations:  3. Referral(s): [place] (LME/Outside Clinic) The Patient has been given information to follow up with [facility] .  4. "From scale of 1-10, how likely are you to follow plan?":   Lesli Albee, Student-Social Work

## 2019-11-25 NOTE — BH Specialist Note (Signed)
Integrated Behavioral Health Follow Up Visit  MRN: [id number] Name: DRAY DENTE  Type of Service: [place] Interpretor:No. Interpretor Name and Language: none   SUBJECTIVE: Scott Juarez is a 29 y.o. male accompanied by mother Patient was referred by [name], NP  for mental health  Patient reports the following symptoms/concerns: "The patient stated he has been in a really bad place for the last two weeks." He reported that his girlfriend broke up with him because she said he was always angry for no reason. The patient reported his grandfather whom he sees as his father was hospitalized last night.The patient stated he feels the Mirtazapine he was prescribed helped him sleep for the first night, but stopped working after that. He states that his mother has asked him to stop taking the medication because his "mood has become all over the place" since he started taking the medication. He reports not eating for two days, and not sleeping until the sun comes up. He stated he doesn't sleep more that a couple hours at a time and was not able to sleep at all last night. The patient states he was late to work twice this week. He reported that "I went to work and sat in my truck lost in my thoughts; this happened on three days." The patient reported,  "I have been thinking a lot about how it would be to just be dead or not even being born." The patient denied having a plan to end his life. The patient stated, " I'm scared because of these thoughts and I don't want to hurt myself".  He stated he has shared some of his thoughts with his mom and it has made her cry twice. The patient stated he feels guilty about making his mom cry. He denied having access to any firearms. The patient is experiencing Suicide ideation, the patient denies means or access to means to carry out a plan. .  Duration of problem:; Severity of problem: severe  OBJECTIVE: Mood:  Depressed and Hopeless and Affect: Tearful Risk of harm to self or others: Suicidal ideation was reported by the patient. Patient denies intent , means and access to means to carry out a plan. The Patient reported no firearms in the home.   LIFE CONTEXT: Family and Social: see above School/Work: see above Self-Care: see above Life Changes: see above  GOALS ADDRESSED: Patient will: 1.  Reduce symptoms of: anxiety, depression and mood instability  2.  Increase knowledge and/or ability of: coping skills, healthy habits and stress reduction  3.  Demonstrate ability to: Increase healthy adjustment to current life circumstances, Improve medication compliance and Decrease self-medicating behaviors  INTERVENTIONS: Interventions utilized:  Brief CBT was utilized by the clinician during today's follow up visit. Clinician processed with patient how he has been doing since his last follow up visit. The clinician asked the patient if he has had anymore panic attacks. The clinician asked the patient if the Mirtazapine 15 MG has helped his anxiety and depression. The clinician measured the patients anxiety and depression on a numerical scale. Clinician completed a suicide risk assessment on the patient. Clinician asked the patient if he had intent, means and access to means to carry out a plan.Clinician determined the patient is competent thinking clearly and the patient is oriented x 3 and that he is able to make his own decisions and that the patient is not acutely suicidal. The clinician reached out to Dr. Royston Bake MD, Psychiatrist  for case consultation during the session. Clinician asked the patient if he would be willing to go with his mother to the [facility] walk-in for further evaluation. The Clinician spoke to [place] [facility] who confirmed the Patient had arrived at [facility] and was being seen by the crisis team. Standardized Assessments completed: GAD-7 and PHQ 9  Gad -7  19 and PHQ-9  24   ASSESSMENT: Patient currently experiencing see above  Patient may benefit from see above.  PLAN: 1. Follow up with behavioral health clinician on :  2. Behavioral recommendations: see above 3. Referral(s): [facility] (In Clinic) and [facility] (LME/Outside Clinic) Client accompanied by his mother was sent to [facility]  4. "From scale of 1-10, how likely are you to follow plan?": [place], Student-Social Work

## 2019-11-26 ENCOUNTER — Telehealth: Payer: Self-pay | Admitting: Licensed Clinical Social Worker

## 2019-11-26 ENCOUNTER — Other Ambulatory Visit: Payer: Self-pay

## 2019-11-26 MED ORDER — TRAZODONE HCL 50 MG PO TABS: ORAL_TABLET | ORAL | 0 refills | Status: DC

## 2019-11-26 NOTE — Telephone Encounter (Signed)
Called patient's home number to let him know that Jerrilyn Cairo needs to see him this morning to sign some paperwork and to chat about a few items. Called patients phone number and relayed the above message.  Patient agreed to come in today at 9:30 a. m.

## 2019-12-02 ENCOUNTER — Telehealth: Payer: Self-pay | Admitting: Licensed Clinical Social Worker

## 2019-12-02 NOTE — Telephone Encounter (Signed)
I returned the patient's call and left a voicemail with contact information.

## 2019-12-23 ENCOUNTER — Other Ambulatory Visit: Payer: Self-pay | Admitting: Gerontology

## 2019-12-31 ENCOUNTER — Other Ambulatory Visit: Payer: Self-pay | Admitting: Gerontology

## 2019-12-31 ENCOUNTER — Ambulatory Visit: Payer: Self-pay | Admitting: Gerontology

## 2019-12-31 ENCOUNTER — Other Ambulatory Visit: Payer: Self-pay

## 2019-12-31 VITALS — BP 140/78 | HR 73 | Temp 97.3°F | Resp 16 | Wt 208.7 lb

## 2019-12-31 DIAGNOSIS — Z0000 Encounter for general adult medical examination without abnormal findings: Secondary | ICD-10-CM | POA: Insufficient documentation

## 2019-12-31 DIAGNOSIS — F17200 Nicotine dependence, unspecified, uncomplicated: Secondary | ICD-10-CM

## 2019-12-31 DIAGNOSIS — Z8659 Personal history of other mental and behavioral disorders: Secondary | ICD-10-CM

## 2019-12-31 MED ORDER — TRAZODONE HCL 50 MG PO TABS: ORAL_TABLET | ORAL | 0 refills | Status: AC

## 2019-12-31 NOTE — Patient Instructions (Signed)

## 2019-12-31 NOTE — Progress Notes (Signed)
Established Patient Office Visit  Subjective:  Patient ID: Scott Juarez, male    DOB: October 25, 1990  Age: 29 y.o. MRN: [id number]  CC: No chief complaint on file.   HPI Scott Juarez is a 29 y.o male who presents for follow up anxiety and medication refill. He reports being compliant with his treatment regimen and states that everything is going well. He was seen by Jerrilyn Cairo on 11/25/2019 for behavioral health. He states that he is looking forward to his appointment with [facility] in November. He denies suicidal or homicidal ideation. Flu vaccine was administered during this visit.Overall, he states that he is doing well and offers no further complaint.  Past Medical History:  Diagnosis Date  . Anxiety     No past surgical history on file.  No family history on file.  Social History   Socioeconomic History  . Marital status: Single    Spouse name: Not on file  . Number of children: Not on file  . Years of education: Not on file  . Highest education level: Not on file  Occupational History  . Not on file  Tobacco Use  . Smoking status: Current Every Day Smoker    Packs/day: 0.50    Types: Cigarettes  . Smokeless tobacco: Never Used  Vaping Use  . Vaping Use: Never used  Substance and Sexual Activity  . Alcohol use: No  . Drug use: No  . Sexual activity: Not on file  Other Topics Concern  . Not on file  Social History Narrative  . Not on file   Social Determinants of Health   Financial Resource Strain: Medium Risk  . Difficulty of Paying Living Expenses: Somewhat hard  Food Insecurity: No Food Insecurity  . Worried About Charity fundraiser in the Last Year: Never true  . Ran Out of Food in the Last Year: Never true  Transportation Needs: No Transportation Needs  . Lack of Transportation (Medical): No  . Lack of Transportation (Non-Medical): No  Physical Activity: Insufficiently Active  . Days of Exercise per Week: 3 days  . Minutes of Exercise per  Session: 40 min  Stress: Stress Concern Present  . Feeling of Stress : To some extent  Social Connections: Moderately Isolated  . Frequency of Communication with Friends and Family: Three times a week  . Frequency of Social Gatherings with Friends and Family: Three times a week  . Attends Religious Services: 1 to 4 times per year  . Active Member of Clubs or Organizations: No  . Attends Archivist Meetings: Never  . Marital Status: Separated  Intimate Partner Violence: Not At Risk  . Fear of Current or Ex-Partner: No  . Emotionally Abused: No  . Physically Abused: No  . Sexually Abused: No    Outpatient Medications Prior to Visit  Medication Sig Dispense Refill  . ibuprofen (ADVIL) 600 MG tablet Take 1 tablet (600 mg total) by mouth every 8 (eight) hours as needed. 15 tablet 0  . ondansetron (ZOFRAN ODT) 4 MG disintegrating tablet Take 1 tablet (4 mg total) by mouth every 6 (six) hours as needed for nausea or vomiting. 20 tablet 0  . traZODone (DESYREL) 50 MG tablet Take 25mg  (1/2 tablet) twice a day and 50mg  (whole tablet) at bedtime. 60 tablet 0   No facility-administered medications prior to visit.    Allergies  Allergen Reactions  . Amoxicillin Hives    ROS Review of Systems  Constitutional: Negative.   HENT:  Negative.   Eyes: Negative.   Respiratory: Negative.   Cardiovascular: Negative.   Gastrointestinal: Negative.   Endocrine: Negative.   Genitourinary: Negative.   Musculoskeletal: Negative.   Skin: Negative.   Neurological: Negative.   Psychiatric/Behavioral: Negative.       Objective:    Physical Exam Constitutional:      Appearance: Normal appearance. He is normal weight.  HENT:     Head: Normocephalic and atraumatic.     Nose: Nose normal.  Eyes:     Pupils: Pupils are equal, round, and reactive to light.  Cardiovascular:     Rate and Rhythm: Normal rate and regular rhythm.     Pulses: Normal pulses.     Heart sounds: Normal heart  sounds.  Pulmonary:     Effort: Pulmonary effort is normal.     Breath sounds: Normal breath sounds.  Abdominal:     General: Abdomen is flat.  Musculoskeletal:        General: Normal range of motion.     Cervical back: Normal range of motion.  Neurological:     General: No focal deficit present.     Mental Status: He is alert.  Psychiatric:        Mood and Affect: Mood normal.        Behavior: Behavior normal.        Thought Content: Thought content normal.        Judgment: Judgment normal.     There were no vitals taken for this visit. Wt Readings from Last 3 Encounters:  10/07/19 199 lb (90.3 kg)  10/04/19 181 lb (82.1 kg)  06/10/19 170 lb (77.1 kg)     Health Maintenance Due  Topic Date Due  . Hepatitis C Screening  Never done  . COVID-19 Vaccine (1) Never done  . HIV Screening  Never done  . TETANUS/TDAP  Never done  . INFLUENZA VACCINE  Never done    There are no preventive care reminders to display for this patient.  Lab Results  Component Value Date   TSH 1.700 10/07/2019   Lab Results  Component Value Date   WBC 8.3 10/07/2019   HGB 14.8 10/07/2019   HCT 44.8 10/07/2019   MCV 92 10/07/2019   PLT 227 10/07/2019   Lab Results  Component Value Date   NA 140 10/07/2019   K 3.7 10/07/2019   CO2 24 10/07/2019   GLUCOSE 75 10/07/2019   BUN 11 10/07/2019   CREATININE 1.09 10/07/2019   BILITOT 0.4 10/07/2019   ALKPHOS 94 10/07/2019   AST 26 10/07/2019   ALT 17 10/07/2019   PROT 7.0 10/07/2019   ALBUMIN 4.6 10/07/2019   CALCIUM 9.8 10/07/2019   ANIONGAP 8 06/10/2019   No results found for: CHOL No results found for: HDL No results found for: LDLCALC No results found for: TRIG No results found for: CHOLHDL No results found for: HGBA1C    Assessment & Plan:   1. Health care maintenance Flu vaccine was administered during this visit  - Flu Vaccine QUAD 6+ mos PF IM (Fluarix Quad PF)  2. History of anxiety He reports decreased anxiety  since starting his Trazodone. He will continue with treatment regimen.  He will continue to follow up with Jerrilyn Cairo for behavioral health He has a scheduled appointment with [facility] on 01/17/2020  3. Smoking He was encouraged to continue smoking cessation     Follow-up:  In about 6 months or if symptoms worsens or fail to  improve    Carney Corners, RN

## 2020-01-20 ENCOUNTER — Ambulatory Visit: Payer: Self-pay | Admitting: Pharmacy Technician

## 2020-01-20 ENCOUNTER — Other Ambulatory Visit: Payer: Self-pay

## 2020-01-20 DIAGNOSIS — Z79899 Other long term (current) drug therapy: Secondary | ICD-10-CM

## 2020-01-20 NOTE — Progress Notes (Signed)
Patient still needs to provide bank statement, sign [facility]'s contract, [facility] Attestation and Patient Advocate Letter.  [facility]

## 2020-02-09 ENCOUNTER — Ambulatory Visit: Payer: Self-pay | Admitting: Gerontology

## 2020-03-23 ENCOUNTER — Telehealth: Payer: Self-pay | Admitting: Gerontology

## 2020-05-24 ENCOUNTER — Ambulatory Visit: Payer: Self-pay | Admitting: Gerontology

## 2020-05-26 ENCOUNTER — Ambulatory Visit: Payer: Self-pay | Admitting: Gerontology

## 2020-07-21 ENCOUNTER — Encounter: Payer: Self-pay | Admitting: Gerontology

## 2020-07-21 ENCOUNTER — Ambulatory Visit: Payer: Self-pay | Admitting: Gerontology

## 2020-07-21 ENCOUNTER — Other Ambulatory Visit: Payer: Self-pay

## 2020-07-21 VITALS — BP 125/87 | HR 88 | Temp 97.2°F | Resp 16 | Ht 74.0 in | Wt 188.3 lb

## 2020-07-21 DIAGNOSIS — Z8659 Personal history of other mental and behavioral disorders: Secondary | ICD-10-CM

## 2020-07-21 DIAGNOSIS — F17200 Nicotine dependence, unspecified, uncomplicated: Secondary | ICD-10-CM

## 2020-07-21 DIAGNOSIS — Z0000 Encounter for general adult medical examination without abnormal findings: Secondary | ICD-10-CM

## 2020-07-21 NOTE — Patient Instructions (Signed)

## 2020-07-21 NOTE — Progress Notes (Signed)
Established Patient Office Visit  Subjective:  Patient ID: Scott Juarez, male    DOB: September 06, 1990  Age: 30 y.o. MRN: [id number]  CC:  Chief Complaint  Patient presents with  . Follow-up  . ADHD    Wants to discuss getting back on medications. Patient states he is unable to focus and get normal tasks done at home and work.    HPI Scott Juarez is a 30 year old male who has history of ADHD , Anxiety, presents for follow up and requests for medication for ADHD. He reports that he is easily distracted and unable to focus on tasks. He states that he has been recently having problems with getting his tasks completed at his job and have had complaints concerning his lack of focus by his supervisor at his job. He states that he has used Adderall in the past during his school years. He states that he missed his appointment with [facility] in November 2021. He also admits that he has not been taking his trazodone for the past 2-3 months due to missed appointment and no refill. He states that his mood is good, denies suicidal or homicidal ideation. Overall, he states that he is doing well and offers no further complaints.  Past Medical History:  Diagnosis Date  . ADHD   . Anxiety     Past Surgical History:  Procedure Laterality Date  . NO PAST SURGERIES      Family History  Problem Relation Age of Onset  . Hypertension Mother   . Arthritis Mother   . Breast cancer Mother   . Other Father        unknown medical history  . Bipolar disorder Sister   . Suicidality Brother   . Autism Brother     Social History   Socioeconomic History  . Marital status: Single    Spouse name: Not on file  . Number of children: Not on file  . Years of education: Not on file  . Highest education level: Not on file  Occupational History  . Not on file  Tobacco Use  . Smoking status: Current Every Day Smoker    Packs/day: 0.50    Types: Cigarettes  . Smokeless tobacco: Never Used  Vaping Use  .  Vaping Use: Never used  Substance and Sexual Activity  . Alcohol use: No  . Drug use: Not Currently    Types: Marijuana    Comment: last use ~3 weeks ago  . Sexual activity: Not on file  Other Topics Concern  . Not on file  Social History Narrative  . Not on file   Social Determinants of Health   Financial Resource Strain: Medium Risk  . Difficulty of Paying Living Expenses: Somewhat hard  Food Insecurity: No Food Insecurity  . Worried About Charity fundraiser in the Last Year: Never true  . Ran Out of Food in the Last Year: Never true  Transportation Needs: No Transportation Needs  . Lack of Transportation (Medical): No  . Lack of Transportation (Non-Medical): No  Physical Activity: Insufficiently Active  . Days of Exercise per Week: 3 days  . Minutes of Exercise per Session: 40 min  Stress: Stress Concern Present  . Feeling of Stress : To some extent  Social Connections: Moderately Isolated  . Frequency of Communication with Friends and Family: Three times a week  . Frequency of Social Gatherings with Friends and Family: Three times a week  . Attends Religious Services: 1 to  4 times per year  . Active Member of Clubs or Organizations: No  . Attends Archivist Meetings: Never  . Marital Status: Separated  Intimate Partner Violence: Not At Risk  . Fear of Current or Ex-Partner: No  . Emotionally Abused: No  . Physically Abused: No  . Sexually Abused: No    Outpatient Medications Prior to Visit  Medication Sig Dispense Refill  . traZODone (DESYREL) 50 MG tablet Take 69m (1/2 tablet) twice a day and 592m(whole tablet) at bedtime. (Patient not taking: Reported on 07/21/2020) 60 tablet 0   No facility-administered medications prior to visit.    Allergies  Allergen Reactions  . Amoxicillin Hives and Anaphylaxis    ROS Review of Systems  Constitutional: Negative.   HENT: Negative.   Respiratory: Negative.   Cardiovascular: Negative.   Gastrointestinal:  Negative.   Skin: Negative.   Neurological: Negative.   Psychiatric/Behavioral: The patient is nervous/anxious.       Objective:    Physical Exam Constitutional:      Appearance: Normal appearance.  HENT:     Nose: Nose normal.  Cardiovascular:     Rate and Rhythm: Normal rate and regular rhythm.     Pulses: Normal pulses.     Heart sounds: Normal heart sounds.  Pulmonary:     Effort: Pulmonary effort is normal.     Breath sounds: Normal breath sounds.  Abdominal:     General: Abdomen is flat. Bowel sounds are normal.     Palpations: Abdomen is soft.  Musculoskeletal:        General: Normal range of motion.  Skin:    General: Skin is warm.     Capillary Refill: Capillary refill takes less than 2 seconds.  Neurological:     General: No focal deficit present.     Mental Status: He is alert.  Psychiatric:        Mood and Affect: Mood normal.     BP 125/87 (BP Location: Right Arm, Patient Position: Sitting, Cuff Size: Large)   Pulse 88   Temp (!) 97.2 F (36.2 C)   Resp 16   Ht 6' 2"  (1.88 m)   Wt 188 lb 4.8 oz (85.4 kg)   SpO2 97%   BMI 24.18 kg/m  Wt Readings from Last 3 Encounters:  07/21/20 188 lb 4.8 oz (85.4 kg)  12/31/19 208 lb 11.2 oz (94.7 kg)  10/07/19 199 lb (90.3 kg)     Health Maintenance Due  Topic Date Due  . COVID-19 Vaccine (1) Never done  . HIV Screening  Never done  . Hepatitis C Screening  Never done  . TETANUS/TDAP  Never done    There are no preventive care reminders to display for this patient.  Lab Results  Component Value Date   TSH 1.700 10/07/2019   Lab Results  Component Value Date   WBC 8.3 10/07/2019   HGB 14.8 10/07/2019   HCT 44.8 10/07/2019   MCV 92 10/07/2019   PLT 227 10/07/2019   Lab Results  Component Value Date   NA 140 10/07/2019   K 3.7 10/07/2019   CO2 24 10/07/2019   GLUCOSE 75 10/07/2019   BUN 11 10/07/2019   CREATININE 1.09 10/07/2019   BILITOT 0.4 10/07/2019   ALKPHOS 94 10/07/2019   AST 26  10/07/2019   ALT 17 10/07/2019   PROT 7.0 10/07/2019   ALBUMIN 4.6 10/07/2019   CALCIUM 9.8 10/07/2019   ANIONGAP 8 06/10/2019   No results  found for: CHOL No results found for: HDL No results found for: LDLCALC No results found for: TRIG No results found for: CHOLHDL No results found for: HGBA1C    Assessment & Plan:    1. Smoking He was encouraged to continue on smoking cessation.  2. History of ADHD He was encouraged to follow up with [facility], information was give to patient.  He will follow up with Jerrilyn Cairo for behavioral health He reports that anxiety is stable, and was advised to call the Crisis help line for worsening symptoms.  3. Health care maintenance -Routine labs will be checked. - CBC w/Diff; Future - Comp Met (CMET); Future - Lipid Profile; Future - Urinalysis; Future  Follow-up: In about 3 months or if symptoms worsens or fail to improve.

## 2020-10-12 ENCOUNTER — Other Ambulatory Visit: Payer: Self-pay

## 2020-10-19 ENCOUNTER — Ambulatory Visit: Payer: Self-pay | Admitting: Gerontology

## 2020-10-19 ENCOUNTER — Other Ambulatory Visit: Payer: Self-pay

## 2020-10-20 ENCOUNTER — Ambulatory Visit: Payer: Self-pay | Admitting: Gerontology

## 2020-10-27 ENCOUNTER — Ambulatory Visit: Payer: Self-pay | Admitting: Gerontology

## 2020-12-18 ENCOUNTER — Emergency Department
Admission: EM | Admit: 2020-12-18 | Discharge: 2020-12-18 | Disposition: A | Discharge status: Home / Self Care | Payer: Self-pay | Attending: Emergency Medicine | Admitting: Emergency Medicine

## 2020-12-18 ENCOUNTER — Emergency Department: Payer: Self-pay

## 2020-12-18 ENCOUNTER — Encounter: Payer: Self-pay | Admitting: Physician Assistant

## 2020-12-18 ENCOUNTER — Other Ambulatory Visit: Payer: Self-pay

## 2020-12-18 DIAGNOSIS — M25511 Pain in right shoulder: Secondary | ICD-10-CM

## 2020-12-18 DIAGNOSIS — W109XXA Fall (on) (from) unspecified stairs and steps, initial encounter: Secondary | ICD-10-CM | POA: Insufficient documentation

## 2020-12-18 DIAGNOSIS — S46911A Strain of unspecified muscle, fascia and tendon at shoulder and upper arm level, right arm, initial encounter: Secondary | ICD-10-CM | POA: Insufficient documentation

## 2020-12-18 DIAGNOSIS — Y9301 Activity, walking, marching and hiking: Secondary | ICD-10-CM | POA: Insufficient documentation

## 2020-12-18 DIAGNOSIS — F17210 Nicotine dependence, cigarettes, uncomplicated: Secondary | ICD-10-CM | POA: Insufficient documentation

## 2020-12-18 MED ORDER — CYCLOBENZAPRINE HCL 5 MG PO TABS: 5.0000 mg | ORAL_TABLET | Freq: Three times a day (TID) | ORAL | 0 refills | Status: AC | PRN

## 2020-12-18 MED ORDER — NAPROXEN 500 MG PO TABS
500.0000 mg | ORAL_TABLET | Freq: Once | ORAL | Status: AC
  Administered 2020-12-18: 500 mg via ORAL
  Filled 2020-12-18: qty 1

## 2020-12-18 MED ORDER — NABUMETONE 750 MG PO TABS: 750.0000 mg | ORAL_TABLET | Freq: Two times a day (BID) | ORAL | 1 refills | Status: AC

## 2020-12-18 MED ORDER — TRAMADOL HCL 50 MG PO TABS
50.0000 mg | ORAL_TABLET | Freq: Once | ORAL | Status: AC
Start: 2020-12-18 — End: 2020-12-18
  Administered 2020-12-18: 50 mg via ORAL
  Filled 2020-12-18: qty 1

## 2020-12-18 NOTE — ED Triage Notes (Signed)
Pt states he fell down stairs last week injuring right shoulder. Pt states he is continuing to have pain in shoulder and feels a pop when h e moves shoulder.

## 2020-12-18 NOTE — Discharge Instructions (Addendum)
Your exam and x-ray are reassuring for any acute fracture or dislocation.  You may have a shoulder strain.  Take prescription meds as directed.  Follow-up with Ortho for ongoing symptoms.

## 2020-12-18 NOTE — ED Notes (Signed)
Dc instructions and scripts reviewed with pt no questions or concerns at this time. Will follow up with ortho  ?

## 2020-12-19 NOTE — ED Provider Notes (Signed)
[facility] Emergency Department Provider Note ____________________________________________  Time seen: 2213  I have reviewed the triage vital signs and the nursing notes.  HISTORY  Chief Complaint  Shoulder Pain   HPI Scott Juarez is a 30 y.o. male presents to the ED with ongoing right shoulder pain.  Patient describes an injury that occurred last week as he was walking on the steps with a nightstand in his hands.  He apparently tripped and landed falling into the wall with his right shoulder.  He presents now with ongoing intermittent shoulder pain.  He describes pain is worse with range of motion and attempted sleep on the shoulder at nighttime.  Patient denies any distal paresthesias or grip changes.  No other injury reported at this time.  Past Medical History:  Diagnosis Date   ADHD    Anxiety     Patient Active Problem List   Diagnosis Date Noted   History of ADHD 07/21/2020   Health care maintenance 12/31/2019   Encounter to establish care 10/07/2019   History of anxiety 10/07/2019   Smoking 10/07/2019    Past Surgical History:  Procedure Laterality Date   NO PAST SURGERIES      Prior to Admission medications   Medication Sig Start Date End Date Taking? Authorizing Provider  cyclobenzaprine (FLEXERIL) 5 MG tablet Take 1 tablet (5 mg total) by mouth 3 (three) times daily as needed. 12/18/20  Yes Shinita Mac, Dannielle Karvonen, PA-C  nabumetone (RELAFEN) 750 MG tablet Take 1 tablet (750 mg total) by mouth 2 (two) times daily for 15 days. 12/18/20 01/02/21 Yes [place], Dannielle Karvonen, PA-C  traZODone (DESYREL) 50 MG tablet Take 25mg  (1/2 tablet) twice a day and 50mg  (whole tablet) at bedtime. Patient not taking: Reported on 07/21/2020 12/31/19   Caryl Asp E, NP    Allergies Amoxicillin  Family History  Problem Relation Age of Onset   Hypertension Mother    Arthritis Mother    Breast cancer Mother    Other Father        unknown  medical history   Bipolar disorder Sister    Suicidality Brother    Autism Brother     Social History Social History   Tobacco Use   Smoking status: Every Day    Packs/day: 0.50    Types: Cigarettes   Smokeless tobacco: Never  Vaping Use   Vaping Use: Never used  Substance Use Topics   Alcohol use: No   Drug use: Not Currently    Types: Marijuana    Comment: last use ~3 weeks ago    Review of Systems  Constitutional: Negative for fever. Eyes: Negative for visual changes. ENT: Negative for sore throat. Cardiovascular: Negative for chest pain. Respiratory: Negative for shortness of breath. Gastrointestinal: Negative for abdominal pain, vomiting and diarrhea. Genitourinary: Negative for dysuria. Musculoskeletal: Negative for back pain.  Right shoulder pain as above Skin: Negative for rash. Neurological: Negative for headaches, focal weakness or numbness. ____________________________________________  PHYSICAL EXAM:  VITAL SIGNS: ED Triage Vitals  Enc Vitals Group     BP 12/18/20 2041 (!) 168/94     Pulse Rate 12/18/20 2041 89     Resp 12/18/20 2041 16     Temp 12/18/20 2244 98.2 F (36.8 C)     Temp Source 12/18/20 2244 Oral     SpO2 12/18/20 2041 100 %     Weight 12/18/20 2041 188 lb (85.3 kg)     Height 12/18/20 2041 6\' 2"  (  1.88 m)     Head Circumference --      Peak Flow --      Pain Score 12/18/20 2041 7     Pain Loc --      Pain Edu? --      Excl. in [place]? --     Constitutional: Alert and oriented. Well appearing and in no distress. Head: Normocephalic and atraumatic. Eyes: Conjunctivae are normal.  Normal extraocular movements Neck: Supple. No thyromegaly. Cardiovascular: Normal rate, regular rhythm. Normal distal pulses. Respiratory: Normal respiratory effort. No wheezes/rales/rhonchi. Gastrointestinal: Soft and nontender. No distention. Musculoskeletal: Shoulder without obvious deformity or dislocation.  No sulcus sign is appreciated.  Patient with  normal active range of motion to the shoulder in all planes.  Normal rotator cuff testing is noted without deficit.  No internal derangement is suspected.  Normal composite grip distally.  Nontender with normal range of motion in all extremities.  Neurologic: Cranial nerves II to XII grossly intact.  Normal UE DTRs bilaterally.  Normal gait without ataxia. Normal speech and language. No gross focal neurologic deficits are appreciated. Skin:  Skin is warm, dry and intact. No rash noted. Psychiatric: Mood and affect are normal. Patient exhibits appropriate insight and judgment. ____________________________________________    {LABS (pertinent positives/negatives)  ____________________________________________  {EKG  ____________________________________________   RADIOLOGY Official radiology report(s): DG Shoulder Right  Result Date: 12/18/2020 CLINICAL DATA:  Fall downstairs last week with right shoulder pain, initial encounter EXAM: RIGHT SHOULDER - 2+ VIEW COMPARISON:  10/04/2019 FINDINGS: There is no evidence of fracture or dislocation. There is no evidence of arthropathy or other focal bone abnormality. Soft tissues are unremarkable. IMPRESSION: No acute abnormality noted. Electronically Signed   By: Inez Catalina M.D.   On: 12/18/2020 21:02   ____________________________________________  PROCEDURES  Naproxen 500 mg p.o. Ultram 50 mg p.o.  Procedures ____________________________________________   INITIAL IMPRESSION / ASSESSMENT AND PLAN / ED COURSE  As part of my medical decision making, I reviewed the following data within the [place] reviewed WNL and Notes from prior ED visits   Patient ED evaluation of right shoulder pain and disability following mechanical injury.  Patient is evaluated for his complaints in the ED, and found to have a negative x-ray and notification of the acute rotator cuff deficit.  Patient's symptoms may represent a rotator cuff  strain or shoulder contusion.  Patient is discharged with a prescription for an anti-inflammatory muscle relaxant.  He is referred to Ortho for ongoing management.  Return precautions of been reviewed.  Work is provided as requested.  Scott Juarez was evaluated in Emergency Department on 12/19/2020 for the symptoms described in the history of present illness. He was evaluated in the context of the global COVID-19 pandemic, which necessitated consideration that the patient might be at risk for infection with the SARS-CoV-2 virus that causes COVID-19. Institutional protocols and algorithms that pertain to the evaluation of patients at risk for COVID-19 are in a state of rapid change based on information released by regulatory bodies including the CDC and federal and state organizations. These policies and algorithms were followed during the patient's care in the ED. ____________________________________________  FINAL CLINICAL IMPRESSION(S) / ED DIAGNOSES  Final diagnoses:  Acute pain of right shoulder  Strain of right shoulder, initial encounter      Melvenia Needles, PA-C 12/19/20 0016    Duffy Bruce, MD 12/21/20 1456
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
## 2021-02-16 ENCOUNTER — Other Ambulatory Visit: Payer: Self-pay

## 2022-03-06 IMAGING — CR DG SHOULDER 2+V*R*
1 series · 3 of 3 positions shown · non-contrast
Comparison: 10/04/2019

CLINICAL DATA: Fall downstairs last week with right shoulder pain,
initial encounter

EXAM:
RIGHT SHOULDER - 2+ VIEW

[Series 1: w shoulder external right · 0.14mm/px · 3 of 3 slices shown]
[im 1/3]
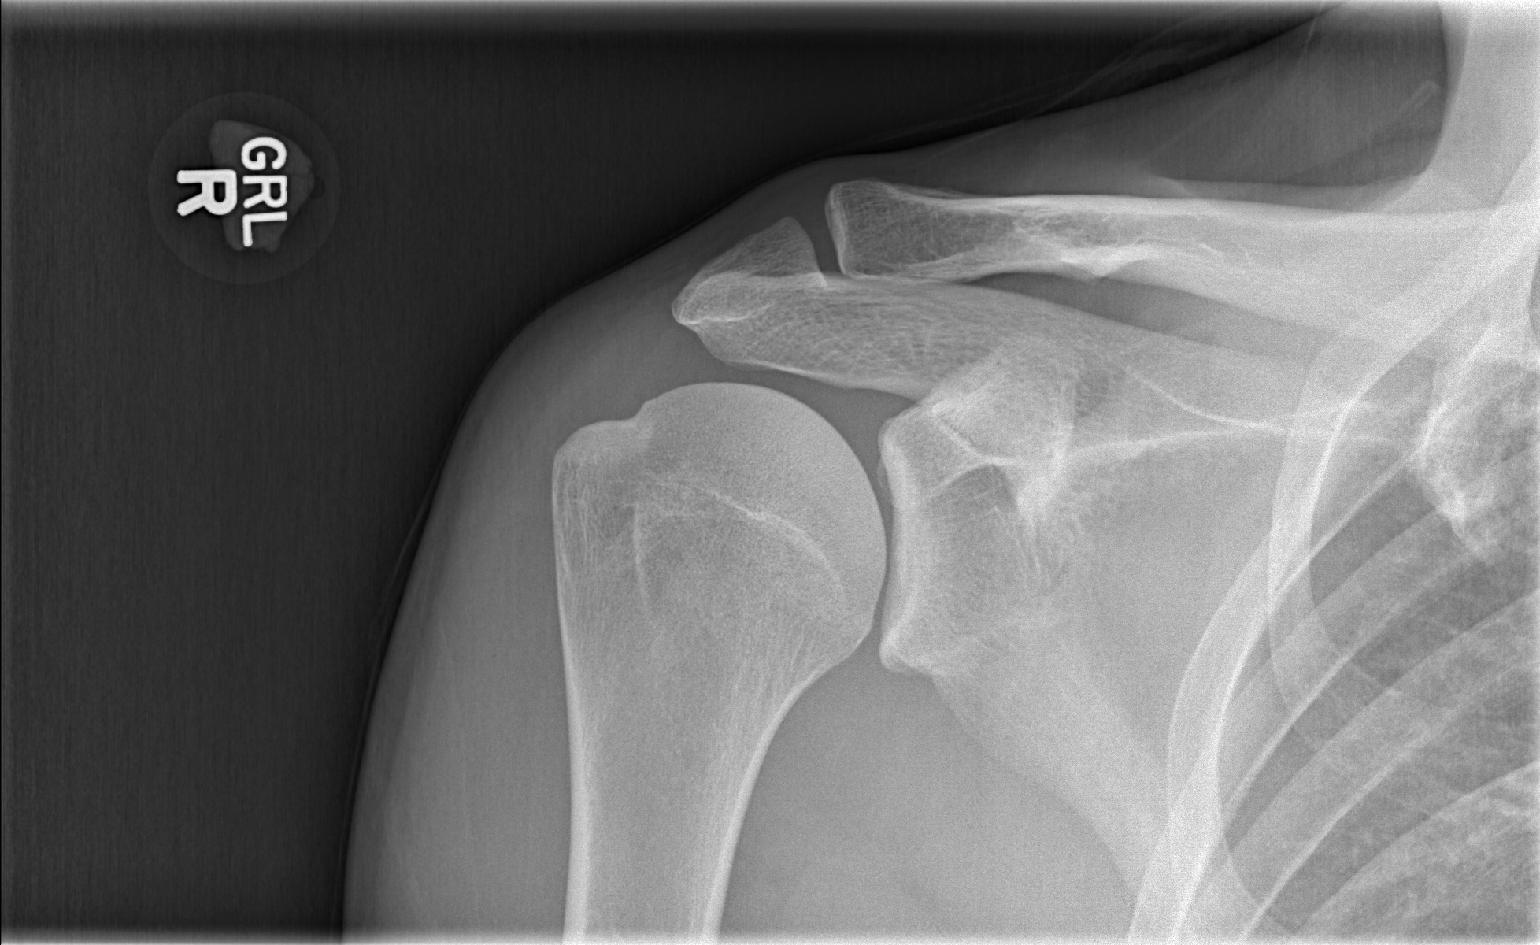
[im 2/3]
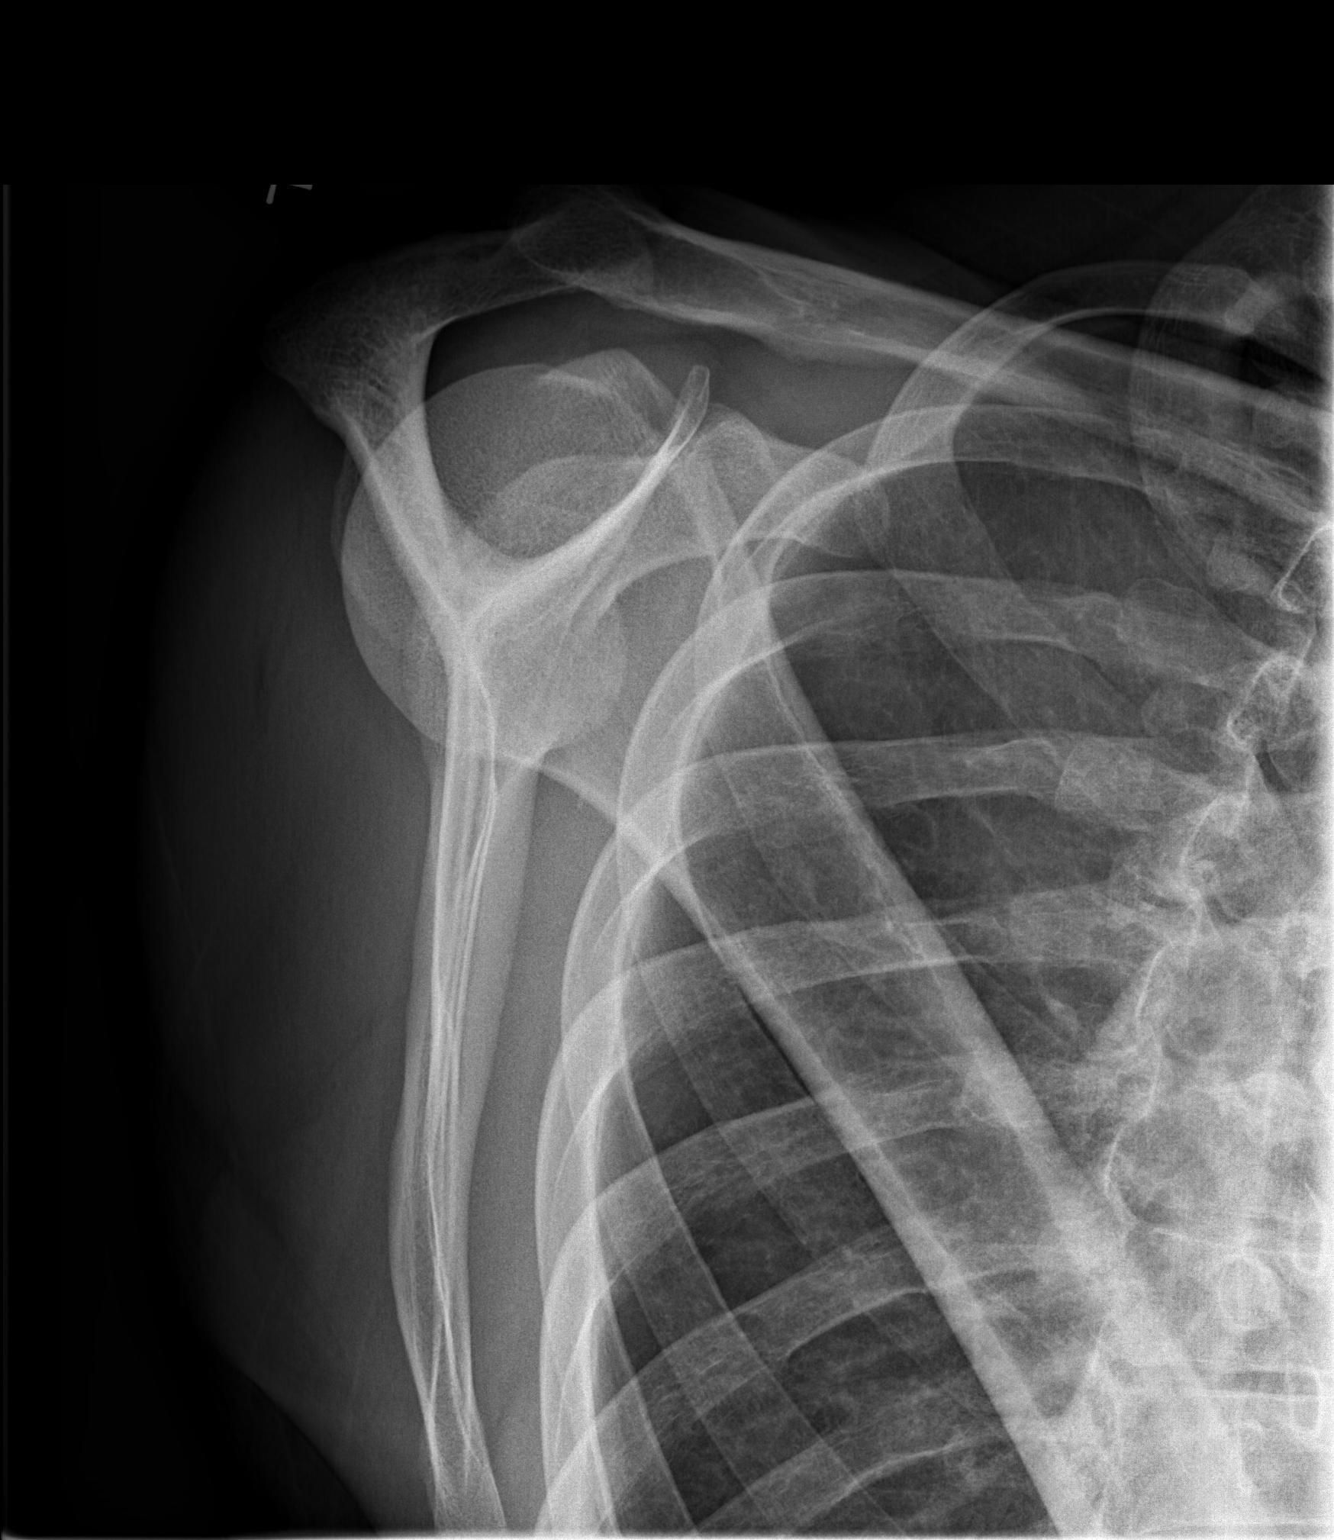
[im 3/3]
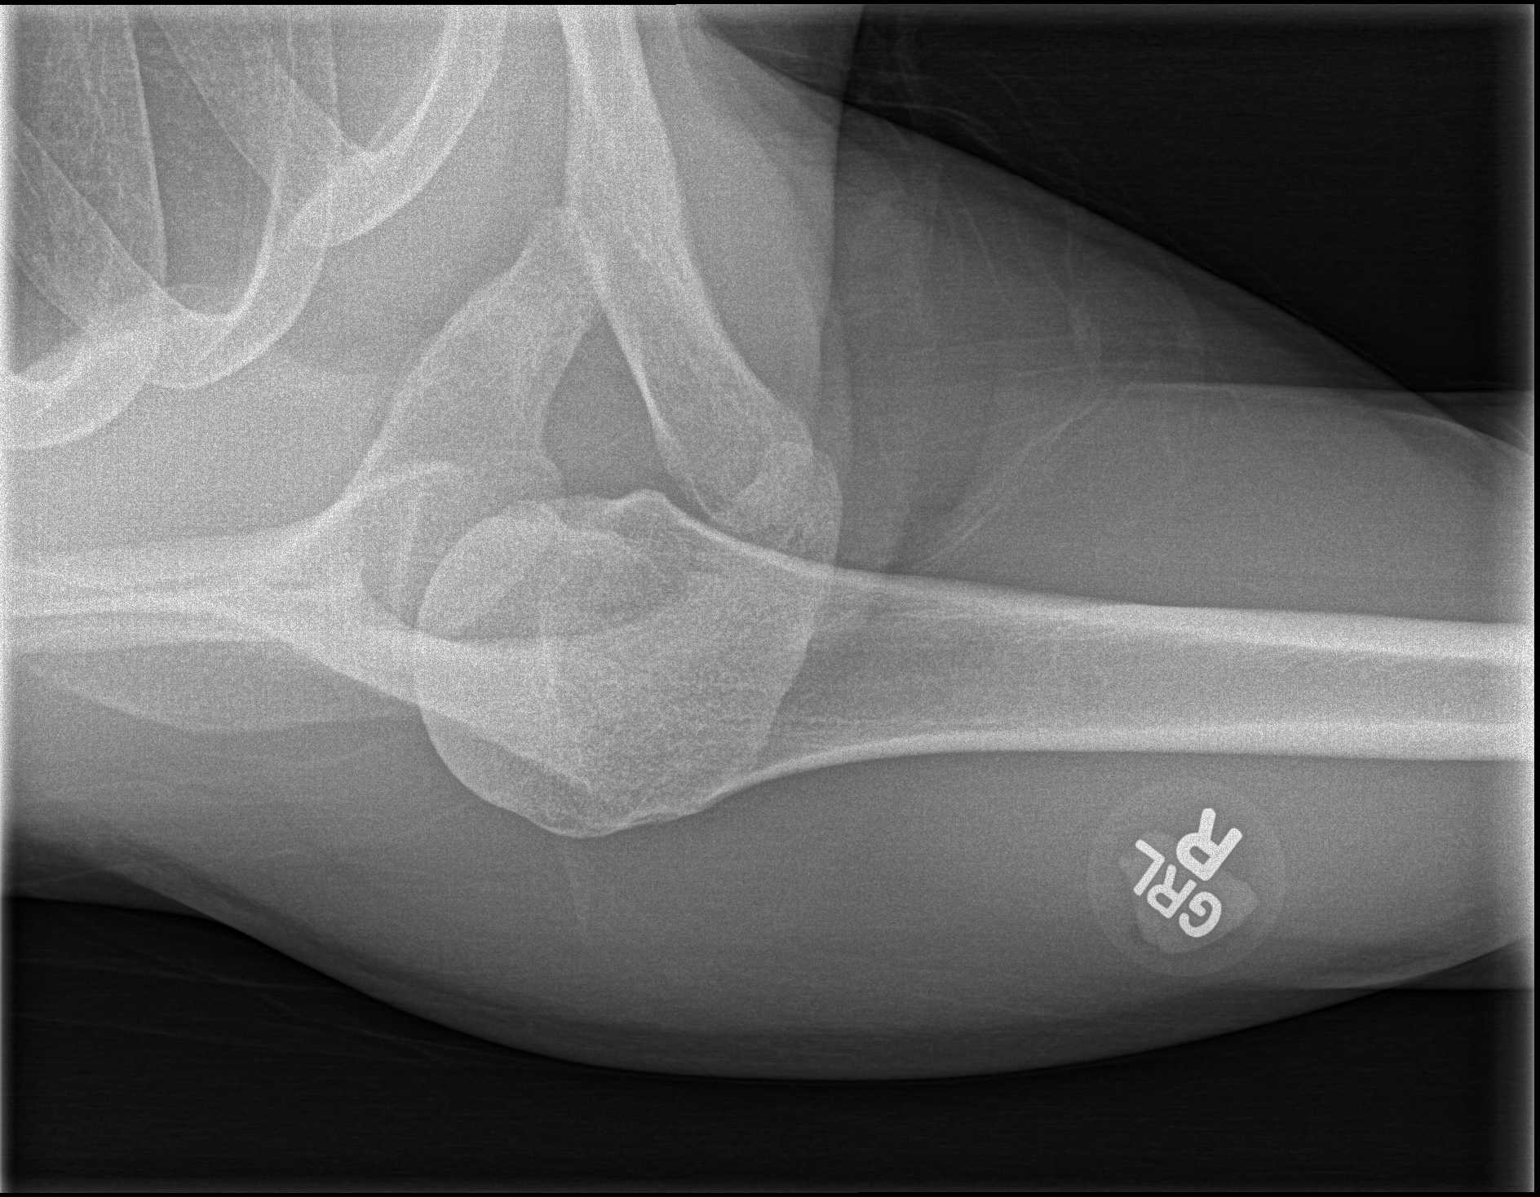

[3 of 3 positions shown; findings below may reference images not displayed]

FINDINGS: There is no evidence of fracture or dislocation. There is no
evidence of arthropathy or other focal bone abnormality. Soft
tissues are unremarkable.
IMPRESSION: No acute abnormality noted.
